# Patient Record
Sex: Female | Born: 1970 | Race: White | Hispanic: No | Marital: Married | Smoking: Never smoker
Health system: Southern US, Community
[De-identification: ages and names within clinical notes are randomized; demographics above are authoritative.]

## PROBLEM LIST (undated history)

## (undated) DIAGNOSIS — D352 Benign neoplasm of pituitary gland: Secondary | ICD-10-CM

## (undated) HISTORY — DX: Benign neoplasm of pituitary gland: D35.2

---

## 1999-05-09 ENCOUNTER — Other Ambulatory Visit: Admission: RE | Admit: 1999-05-09 | Discharge: 1999-05-09 | Payer: Self-pay | Admitting: *Deleted

## 2000-02-20 ENCOUNTER — Other Ambulatory Visit: Admission: RE | Admit: 2000-02-20 | Discharge: 2000-02-20 | Payer: Self-pay | Admitting: Obstetrics and Gynecology

## 2000-04-20 ENCOUNTER — Encounter: Payer: Self-pay | Admitting: Obstetrics and Gynecology

## 2000-04-20 ENCOUNTER — Ambulatory Visit (HOSPITAL_COMMUNITY): Admission: RE | Admit: 2000-04-20 | Discharge: 2000-04-20 | Payer: Self-pay | Admitting: Obstetrics and Gynecology

## 2000-05-28 ENCOUNTER — Ambulatory Visit (HOSPITAL_COMMUNITY): Admission: RE | Admit: 2000-05-28 | Discharge: 2000-05-28 | Payer: Self-pay | Admitting: *Deleted

## 2000-05-28 ENCOUNTER — Encounter: Payer: Self-pay | Admitting: *Deleted

## 2000-06-28 ENCOUNTER — Encounter: Payer: Self-pay | Admitting: Obstetrics and Gynecology

## 2000-06-28 ENCOUNTER — Ambulatory Visit (HOSPITAL_COMMUNITY): Admission: RE | Admit: 2000-06-28 | Discharge: 2000-06-28 | Payer: Self-pay | Admitting: Obstetrics and Gynecology

## 2000-07-10 ENCOUNTER — Inpatient Hospital Stay (HOSPITAL_COMMUNITY): Admission: AD | Admit: 2000-07-10 | Discharge: 2000-07-10 | Payer: Self-pay | Admitting: Obstetrics and Gynecology

## 2000-07-18 ENCOUNTER — Encounter: Payer: Self-pay | Admitting: Obstetrics and Gynecology

## 2000-07-18 ENCOUNTER — Ambulatory Visit (HOSPITAL_COMMUNITY): Admission: RE | Admit: 2000-07-18 | Discharge: 2000-07-18 | Payer: Self-pay | Admitting: Obstetrics and Gynecology

## 2000-08-09 ENCOUNTER — Encounter: Payer: Self-pay | Admitting: Obstetrics and Gynecology

## 2000-08-09 ENCOUNTER — Ambulatory Visit (HOSPITAL_COMMUNITY): Admission: RE | Admit: 2000-08-09 | Discharge: 2000-08-09 | Payer: Self-pay | Admitting: Obstetrics and Gynecology

## 2000-08-20 ENCOUNTER — Encounter (INDEPENDENT_AMBULATORY_CARE_PROVIDER_SITE_OTHER): Payer: Self-pay | Admitting: Specialist

## 2000-08-20 ENCOUNTER — Inpatient Hospital Stay (HOSPITAL_COMMUNITY): Admission: AD | Admit: 2000-08-20 | Discharge: 2000-08-22 | Payer: Self-pay | Admitting: Obstetrics and Gynecology

## 2000-08-24 ENCOUNTER — Encounter: Admission: RE | Admit: 2000-08-24 | Discharge: 2000-10-02 | Payer: Self-pay | Admitting: Obstetrics and Gynecology

## 2001-02-11 ENCOUNTER — Other Ambulatory Visit: Admission: RE | Admit: 2001-02-11 | Discharge: 2001-02-11 | Payer: Self-pay | Admitting: Obstetrics and Gynecology

## 2001-04-01 ENCOUNTER — Other Ambulatory Visit: Admission: RE | Admit: 2001-04-01 | Discharge: 2001-04-01 | Payer: Self-pay | Admitting: Obstetrics and Gynecology

## 2001-04-02 ENCOUNTER — Encounter (INDEPENDENT_AMBULATORY_CARE_PROVIDER_SITE_OTHER): Payer: Self-pay | Admitting: Specialist

## 2001-04-02 ENCOUNTER — Other Ambulatory Visit: Admission: RE | Admit: 2001-04-02 | Discharge: 2001-04-02 | Payer: Self-pay | Admitting: Obstetrics and Gynecology

## 2001-10-07 ENCOUNTER — Other Ambulatory Visit: Admission: RE | Admit: 2001-10-07 | Discharge: 2001-10-07 | Payer: Self-pay | Admitting: Obstetrics and Gynecology

## 2002-02-17 ENCOUNTER — Other Ambulatory Visit: Admission: RE | Admit: 2002-02-17 | Discharge: 2002-02-17 | Payer: Self-pay | Admitting: Obstetrics and Gynecology

## 2002-05-21 ENCOUNTER — Other Ambulatory Visit: Admission: RE | Admit: 2002-05-21 | Discharge: 2002-05-21 | Payer: Self-pay | Admitting: Obstetrics and Gynecology

## 2002-10-01 ENCOUNTER — Other Ambulatory Visit: Admission: RE | Admit: 2002-10-01 | Discharge: 2002-10-01 | Payer: Self-pay | Admitting: Obstetrics and Gynecology

## 2002-10-12 ENCOUNTER — Encounter: Payer: Self-pay | Admitting: Gynecology

## 2002-10-12 ENCOUNTER — Ambulatory Visit (HOSPITAL_COMMUNITY): Admission: RE | Admit: 2002-10-12 | Discharge: 2002-10-12 | Payer: Self-pay | Admitting: Gynecology

## 2002-10-30 HISTORY — PX: OTHER SURGICAL HISTORY: SHX169

## 2003-01-19 ENCOUNTER — Inpatient Hospital Stay (HOSPITAL_COMMUNITY): Admission: AD | Admit: 2003-01-19 | Discharge: 2003-01-21 | Payer: Self-pay | Admitting: Family Medicine

## 2003-01-20 ENCOUNTER — Encounter: Payer: Self-pay | Admitting: Internal Medicine

## 2003-03-04 ENCOUNTER — Other Ambulatory Visit: Admission: RE | Admit: 2003-03-04 | Discharge: 2003-03-04 | Payer: Self-pay | Admitting: Obstetrics and Gynecology

## 2003-05-18 ENCOUNTER — Encounter (INDEPENDENT_AMBULATORY_CARE_PROVIDER_SITE_OTHER): Payer: Self-pay

## 2003-05-18 ENCOUNTER — Ambulatory Visit (HOSPITAL_COMMUNITY): Admission: RE | Admit: 2003-05-18 | Discharge: 2003-05-18 | Payer: Self-pay | Admitting: *Deleted

## 2003-11-21 ENCOUNTER — Inpatient Hospital Stay (HOSPITAL_COMMUNITY): Admission: AD | Admit: 2003-11-21 | Discharge: 2003-11-21 | Payer: Self-pay | Admitting: Obstetrics and Gynecology

## 2003-12-17 ENCOUNTER — Ambulatory Visit (HOSPITAL_COMMUNITY): Admission: RE | Admit: 2003-12-17 | Discharge: 2003-12-17 | Payer: Self-pay | Admitting: Obstetrics and Gynecology

## 2004-03-04 ENCOUNTER — Ambulatory Visit (HOSPITAL_COMMUNITY): Admission: RE | Admit: 2004-03-04 | Discharge: 2004-03-04 | Payer: Self-pay | Admitting: Obstetrics and Gynecology

## 2004-04-07 ENCOUNTER — Inpatient Hospital Stay (HOSPITAL_COMMUNITY): Admission: AD | Admit: 2004-04-07 | Discharge: 2004-04-09 | Payer: Self-pay | Admitting: Obstetrics and Gynecology

## 2004-05-25 ENCOUNTER — Other Ambulatory Visit: Admission: RE | Admit: 2004-05-25 | Discharge: 2004-05-25 | Payer: Self-pay | Admitting: Obstetrics and Gynecology

## 2004-08-30 ENCOUNTER — Encounter: Admission: RE | Admit: 2004-08-30 | Discharge: 2004-08-30 | Payer: Self-pay | Admitting: Endocrinology

## 2005-06-21 ENCOUNTER — Other Ambulatory Visit: Admission: RE | Admit: 2005-06-21 | Discharge: 2005-06-21 | Payer: Self-pay | Admitting: Obstetrics and Gynecology

## 2006-06-27 ENCOUNTER — Other Ambulatory Visit: Admission: RE | Admit: 2006-06-27 | Discharge: 2006-06-27 | Payer: Self-pay | Admitting: Obstetrics and Gynecology

## 2006-09-04 ENCOUNTER — Ambulatory Visit: Payer: Self-pay | Admitting: Sports Medicine

## 2006-09-11 ENCOUNTER — Encounter: Admission: RE | Admit: 2006-09-11 | Discharge: 2006-09-11 | Payer: Self-pay | Admitting: Family Medicine

## 2008-08-26 ENCOUNTER — Other Ambulatory Visit: Admission: RE | Admit: 2008-08-26 | Discharge: 2008-08-26 | Payer: Self-pay | Admitting: Gynecology

## 2010-11-19 ENCOUNTER — Encounter: Payer: Self-pay | Admitting: Endocrinology

## 2011-03-17 NOTE — H&P (Signed)
Samantha Savage, Samantha Savage                             ACCOUNT NO.:  [id number]   MEDICAL RECORD NO.:  [id number]                   PATIENT TYPE:  INP   LOCATION:  5727                                 FACILITY:  [facility]   PHYSICIAN:  Sherin Quarry, MD                   DATE OF BIRTH:  August 21, 1971   DATE OF ADMISSION:  01/19/2003  DATE OF DISCHARGE:                                HISTORY & PHYSICAL   HISTORY OF PRESENT ILLNESS:  The patient is a 40 year old lady who has  always been in good health.  She had a normal pregnancy with birth of  fraternal twins about two years ago.  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  She underwent a transphenoidal resection of the  adenoma at the [facility] on January 22, 2003.  Initially, she felt well  postop, but on Friday, she developed increased nausea and since Saturday,  she has been increasingly fatigued.  She has felt dizzy.  She has also noted  some right upper quadrant aching discomfort.  She has a sense of sinus  pressure at the surgical site.  She presented to [facility] with these complaints and a serum sodium level was done as a screen  for cortisol deficiency and it was found to be 128.  Her cortisol level was  drawn and is pending.  The [facility] was contacted and the  neurosurgeon who did the surgery indicated that he frequently sees nausea  and low sodium shortly after this procedure due to SIADH and the effects of  vasopressin.  He recommended extreme fluid restriction and empiric cortisol  replacement, pending the results of cortisol levels; she was therefore  admitted to accomplish these goals.   ALLERGIES:  She is allergic to SULFA DRUGS.   MEDICATIONS:  She takes Tylenol on a p.r.n. basis.   PAST MEDICAL HISTORY:   ILLNESSES:  None.   OPERATIONS:  The only operation that she has had has been the pituitary  operation as outlined above.   SOCIAL HISTORY:  The patient denies a history of cigarette smoking, alcohol  or drug use.   REVIEW OF SYSTEMS:  HEAD:  Denies headache or dizziness.  EYES:  She denies  visual blurring or diplopia.  EARS, NOSE AND THROAT:  She denies earache,  sinus pain or sore throat.  CHEST:  She denies coughing, wheezing or chest  congestion.  CARDIOVASCULAR:  She denies orthopnea, PND or ankle edema.  GI:  She has noticed loss of appetite.  She feels somewhat nauseated.  She has  also noted some right upper quadrant burning discomfort.  GU:  She denies  dysuria.  She thinks her urine is somewhat cloudy.  She is amenorrheic.  NEUROLOGIC:  She denies seizure or stroke.  ENDOCRINE:  See above.  PHYSICAL EXAMINATION:  GENERAL:  On physical exam, she is a very healthy-  appearing young woman who seems somewhat fatigued and nauseated.  HEENT:  Exam is within normal limits.  NECK:  There is no meningismus.  CHEST:  The chest is clear.  BACK:  Examination of the back reveals no CVA or point tenderness.  CARDIOVASCULAR:  Exam reveals normal S1 and S2.  There are no rubs, murmurs  or gallops.  ABDOMEN:  The abdomen is benign.  There are normal bowel sounds.  There is  very mild tenderness to palpation in the right upper quadrant area without  guarding or rebound.  NEUROLOGIC:  Neurologic testing is normal.  EXTREMITIES:  Examination of extremities is normal.   IMPRESSION:  1. 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  2. Status post transphenoidal pituitary surgery for pituitary adenoma,     amenorrhea/galactorrhea syndrome.  3. Right upper quadrant pain, possible cholecystitis.   PLAN:  As suggested by the neurosurgeon, the patient will have a cortisol  level obtained.  She will be placed on hydrocortisone acetate 50 mg in the  a.m., 25 mg in the p.m., pending the results of the cortisol level.  She  will be rigorously fluid-restricted; we  will try to restrict her total fluid  intake to less than 500 mL every 24 hours.  We will follow her BMET very  closely.  She will be given Phenergan for nausea and Tylenol as needed for  headache.  In light of her complaints of right upper quadrant burning  discomfort and her fairly recent pregnancy, we will obtain a gallbladder  ultrasound.  We will continue communication with the neurosurgical physician  to make sure that we are giving her the best possible advise.                                               Sherin Quarry, MD    SY/MEDQ  D:  01/19/2003  T:  01/20/2003  Job:  [id number]   cc:   Duwayne Heck L. Mahaffey, M.D.  [address]  [place]  [place] [address]  Fax: [phone]

## 2011-03-17 NOTE — Discharge Summary (Signed)
[facility]  Patient:    Samantha Savage, Samantha Savage                          MRN: [id number] Adm. Date:  [id number] Disc. Date: [id number] Attending:  Shaune Spittle Dictator:   Miguel Dibble, C.N.M.                           Discharge Summary  DATE OF BIRTH:                August 13, 1971  ADMISSION DIAGNOSES:          Intrauterine pregnancy at 76 1/2 weeks with spontaneous ruptured membranes, primigravida in early labor, negative group beta strep, twin gestation.  DISCHARGE DIAGNOSES:          Intrauterine pregnancy at 58 1/2 weeks with spontaneous ruptured membranes, primigravida in early labor, negative group beta strep, twin gestation, delivered viable twin girl Apgars 8 and 8 and viable twin boy Apgars 8 and 8.  PROCEDURES:                    Electronic fetal monitoring, ultrasound, Pitocin augmentation.  HOSPITAL COURSE:              On October 22 at approximately 42:27 a.m. 40 year old gravida 2, para 0-0-1-0, [name], at 36 3/7 weeks was admitted after ruptured membranes at 3:30 a.m. with clear fluid.  She was having mild contractions.  Negative group beta strep.  She conceived this pregnancy on Clomid.  She had had an ultrasound one and a half weeks earlier that showed a vertex vertex presentation for both twins.  Her spouse and her had decided upon a normal vaginal delivery.  Her cervix was 2 cm, 80% effaced, vertex at -1 with osseous pooling and ferning of amniotic fluid.  Her ultrasound at the hospital upon admission confirmed a vertex vertex presentation.  Upon admission labor proceeded.  She started on Pitocin augmentation at approximately 9 a.m.  By 1930 hours she delivered viable baby girl, Vena Austria, Apgars 8 and 8 and twin B baby boy, Sharlet Salina, Apgars 8 and 8 over an intact perineum with a first degree vaginal laceration repaired without difficulty. She had reassuring fetal heart tones during the course of her labor.   Placenta had two intact three vessel cords and it appears that there were two placentas.  On postpartum day #1, October 23, she was progressing well. Hemoglobin had dropped from 12.9 to 11.3.  She was breast-feeding satisfactorily.  Received assistance from lactation specialist.  She decided on Micronor for contraception.  On postpartum day #2, October 24, she was breast-feeding and bottle feeding.  Had lots of help at home.  She began to complain of a pruritic rash that was erythematous on her left antecubital site.  A consultation with her dermatologist, Dr. Nita Sells, by phone resulted in treatment with Elocon cream.  She would follow up with him in his office postpartum.  Patient was discharged in stable condition.  She would decided to remain in the hospitality suite with her twin infants should they require further hospitalization due to feeding issues.  DISCHARGE INSTRUCTIONS:       Per [facility] OB/GYN booklet.  DISCHARGE MEDICATIONS:        Micronor, Motrin, prenatal vitamins.  DISCHARGE FOLLOW-UP:          In six weeks at [facility]  and in one week with Dr. Margo Aye for dermatology follow-up. DD:  09/19/00 TD:  09/21/00 Job: [id number]

## 2011-03-17 NOTE — Op Note (Signed)
   NAMEMEDRITH, VEILLON                             ACCOUNT NO.:  [id number]   MEDICAL RECORD NO.:  [id number]                   PATIENT TYPE:  AMB   LOCATION:  DAY                                  FACILITY:  [facility]   PHYSICIAN:  Vikki Ports, M.D.         DATE OF BIRTH:  1971-05-13   DATE OF PROCEDURE:  05/18/2003  DATE OF DISCHARGE:                                 OPERATIVE REPORT   PREOPERATIVE DIAGNOSIS:  Chronic abdominal wound.   POSTOPERATIVE DIAGNOSIS:  Chronic abdominal wound.   OPERATION/PROCEDURE:  Revision of abdominal wound scar.   ANESTHESIA:  Local MAC.   DESCRIPTION OF PROCEDURE:  The patient was taken to the operating room and  placed in the supine position.  After adequate MAC anesthesia was induced,  the abdomen was prepped and draped in the normal sterile fashion.  Using 1%  lidocaine local anesthesia, the skin and subcutaneous tissue in the right  lower quadrant was anesthetized.  Elliptical incision was made around the  previous scar and all scar tissue down to normal subcutaneous fat was  excised en bloc.  Adequate hemostasis was insured and the wound was closed  with a running subcuticular Monocryl.  Steri-Strips and sterile dressing  were applied.  The patient tolerated the procedure well and went to PACU in  good condition.                                                Vikki Ports, M.D.    KRH/MEDQ  D:  05/18/2003  T:  05/18/2003  Job:  [id number]

## 2011-03-17 NOTE — Discharge Summary (Signed)
Samantha Savage, Samantha Savage                             ACCOUNT NO.:  [id number]   MEDICAL RECORD NO.:  [id number]                   PATIENT TYPE:  INP   LOCATION:  5727                                 FACILITY:  [facility]   PHYSICIAN:  Jackie Plum, M.D.             DATE OF BIRTH:  1971/07/26   DATE OF ADMISSION:  01/19/2003  DATE OF DISCHARGE:  01/21/2003                                 DISCHARGE SUMMARY   DISCHARGE DIAGNOSES:  1. 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  2. Status post transphenoidal pituitary surgery for adenoma.  Presented with     amenorrhea and galactorrhea.  3. Right upper quadrant abdominal pain, improved.   DISCHARGE MEDICATIONS:  Cortisol 25 mg two tablets daily for two days and  then one tablet daily.   CONSULTS:  None.   PROCEDURES AND STUDIES:  Ultrasound of the abdomen on January 20, 2003, with  no significant abnormality on complete abdominal ultrasound.  The head of  the pancreas was slightly prominent.  No definite mass or dilation of the  pancreatic duct.  There was a small accessory spleen.   LABORATORY DATA:  Sodium 135, potassium 4.3, chloride 103, CO2 25, BUN 9,  creatinine 0.6, glucose 117.  Cortisol level 13.5.  The urinalysis was  negative.   DISPOSITION:  The patient was discharged home.   CONDITION ON DISCHARGE:  Stable.   HISTORY OF PRESENT ILLNESS:  This is a 40 year old female who recently  developed amenorrhea and galactorrhea and was found to have a large  pituitary adenoma, presumably prolactin secreting.  She underwent a  transphenoidal resection of the adenoma at the [facility] on January 22, 2003.  Initially postoperatively she felt fine.  On Friday, she developed  some increased nausea and since Saturday has increasingly felt fatigued and  dizzy.  She also complained of some right upper quadrant aching discomfort.  The patient has had some sinus pressure at the surgical site.   She presented  to [facility] on the day of admission with these  complaints and a serum sodium level was done as a screen for cortisol  deficiency and it was found to be 128.  A cortisol level was drawn.  The  [facility] was contacted and the neurosurgeon who did the surgery  indicated that he frequently sees nausea and low sodium shortly after this  procedure due to SIADH and the effects of vasopressors.  He recommended  extreme fluid restriction and empiric cortisol replacement pending the  results of cortisol levels and she was admitted for further treatment.   HOSPITAL COURSE:  #1 - HYPONATREMIA SECONDARY TO SYNDROME OF INAPPROPRIATE  ANTIDIURETIC HORMONE SECRETION:  The patient was admitted and put on 500 mL  per day fluid restriction.  She was also provided cortisol 50 mg every a.m.  and 25 mg in the p.m.  per her neurosurgeon's recommendations.  BMETs were  drawn every 12 hours.  Her sodium responded and on discharge was 135.  She  continued to have intermittent headaches that were responsive to Tylenol and  had no nausea or vomiting.  Her cortisol is being weaned over the next two  days and then she is to take 25 mg daily.   #2 - RIGHT UPPER QUADRANT PAIN:  The patient's abdomen was soft and  nondistended on initial examination.  She had good bowel sounds.  She had  some mild tenderness to her right upper quadrant.  An ultrasound was  performed with results as noted above.  This possibly may need to be  rescanned in the future if her symptoms do not resolve.   FOLLOW-UP:  The patient has an appointment on January 26, 2003, with Danielle  L. Mahaffey, M.D.   SPECIAL INSTRUCTIONS:  She has been instructed to restrict her fluid intake  to 1 L daily with a maximum of 2 L daily.     Stephanie Swaziland, NP                      Jackie Plum, M.D.    SJ/MEDQ  D:  01/21/2003  T:  01/22/2003  Job:  [id number]   cc:   Duwayne Heck L. Mahaffey, M.D.  [address]  [place]  [place] [address]  Fax: [phone]

## 2011-03-17 NOTE — H&P (Signed)
Samantha Savage, Samantha Savage                             ACCOUNT NO.:  [id number]   MEDICAL RECORD NO.:  [id number]                   PATIENT TYPE:  INP   LOCATION:  9144                                 FACILITY:  [facility]   PHYSICIAN:  Hal Morales, M.D.             DATE OF BIRTH:  October 31, 1970   DATE OF ADMISSION:  04/07/2004  DATE OF DISCHARGE:                                HISTORY & PHYSICAL   Samantha Savage is a 40 year old gravida 3, para 1012, at 39-6/7 weeks who  presented scheduled for induction but was also in early labor.  She denied  any spontaneous rupture, reports uterine contractions every 5 minutes,  positive fetal movement noted.  Induction had been scheduled for favorable  cervix and family childcare issues.  Pregnancy had been remarkable for:  1. Previous twin gestation.  2. History of cryosurgery.  3. History of removal of pituitary tumor.  4. History of previous delivery at 36 weeks.  5. Positive group B strep.    PRENATAL LABS:  Blood type is 0 positive, Rh antibody negative, VDRL  nonreactive, Rubella titer positive, hepatitis B surface antigen negative,  HIV nonreactive.  GC and Chlamydia cultures were negative, Pap was normal.  Hemoglobin upon entering the practice was 12.3, it was 11.6 at 28 weeks.  EDC of April 08, 2004, was established by ultrasound at approximately 10  weeks and in agreement with further ultrasounds during her pregnancy.  One  hour Glucola was elevated at 142, but three hour GGT was normal.  Group B  strep culture was positive at 36 weeks.  GC and Chlamydia cultures were  negative.   HISTORY OF PRESENT PREGNANCY:  Patient appeared at approximately 10 weeks.  She had an ultrasound on her first visit for dating purposes.  She had  echogenic intracardiac focus noted on her 19 week ultrasound but then had a  quadruple screen that was normal.  She did have some significant issues with  acne but she is followed at [facility] by a dermatologist.  She had  another  ultrasound at 24 weeks.  Echogenic intracardiac focus was noted to be  resolved, anatomy was normal with positive nasal bone seen.  She had some  cramping at 25 weeks and was placed on ibuprofen.  She was exposed to fifth  disease at approximately 30 weeks.  Parvo titer is documented immunity.  The  rest of her pregnancy was essentially uncomplicated.  On her last visit on  April 06, 2004, she was noted to be 3 cm, 80% vertex and -1.  She did request  induction secondary to the availability of family care for her twin  children.  Risks and benefits were reviewed with the patient and patient did  agree to accept those risks and benefits.  She is, therefore, scheduled for  induction today.   OBSTETRICAL HISTORY:  In 2000 she had a spontaneous miscarriage at 21  weeks.  In 2001 she had a vaginal birth of twins, first infant was a female infant  weight 5 pounds 4 ounces at 36-4/7 weeks.  She was in labor approximately 30  hours.  She had epidural anesthesia.  The second twin was a female, weight 5  pounds 7 ounces which she delivered vaginally as well.  She achieved  pregnancy in 2001 using Clomid.  She did have some preterm labor at 34 weeks  with those twins but delivered at 36-1/2 weeks.   MEDICAL HISTORY:  She was a previous oral contraceptive user.  She had an  abnormal Pap in 2002, had colposcopy and cryosurgery, now all Paps were  normal.  She has a history of yeast infection.  She has history of the  pituitary tumor that was noncancerous that was removed in March, 2004.  She  has a history of UTIs, wisdom teeth removed in 1993.  She was readmitted to  the hospital in March of 2004 after her surgery due to swelling in her hand.  Genetic history is unremarkable.   ALLERGIES:  SULFA, WHICH CAUSES HIVES.   SOCIAL HISTORY:  Patient is married to the father of the baby, he is  involved and supportive, his name is Keimora Swartout.  The patient is Caucasian,  of Catholic faith.  She is  graduate educated and is employed at [employer].  Her husband has a Naval architect, he is employed in [employer].  She has been followed by the physician service of  [facility].  She denies any alcohol, drug or tobacco use during this  pregnancy.   PHYSICAL EXAM:  VITAL SIGNS:  Stable, patient is afebrile.  HEENT:  Within normal limits.  LUNGS:  The breath sounds are clear.  HEART:  Regular rate and rhythm without murmur.  BREASTS:  Soft and nontender.  ABDOMEN:  Fundal height is approximately 38 cm.  Estimated fetal weight 7-8  pounds.  Uterine contractions every 3-5 minutes, mild to moderate quality.  CERVICAL EXAM:  By R.N.  Exam was 4-5 cm, 100% vertex and -1 station.  Fetal  heart rate is reactive with no decelerations.  EXTREMITIES:  Deep tendon reflexes are 2+ without clonus.  There is no trace  edema noted.   IMPRESSION:  1. Intrauterine pregnancy at 39-6/7 weeks.  2. Early active labor.  3. Positive group B strep.  4. History of benign pituitary tumor.   PLAN:  1. Admit to birthing suite for consult with Dr. Dierdre Forth as attending     physician.  2. Routine physician orders.  3. Plan group B strep prophylaxis with penicillin G per standard dosing.  4. Epidural per patient request.     Renaldo Reel. Emilee Hero, C.N.M.                   Hal Morales, M.D.    Leeanne Mannan  D:  04/07/2004  T:  04/07/2004  Job:  [id number]

## 2013-03-12 ENCOUNTER — Other Ambulatory Visit: Payer: Self-pay | Admitting: Gynecology

## 2013-03-12 DIAGNOSIS — D353 Benign neoplasm of craniopharyngeal duct: Secondary | ICD-10-CM

## 2013-03-12 DIAGNOSIS — E349 Endocrine disorder, unspecified: Secondary | ICD-10-CM

## 2013-03-15 ENCOUNTER — Other Ambulatory Visit: Payer: Self-pay

## 2013-03-18 ENCOUNTER — Other Ambulatory Visit: Payer: Self-pay

## 2013-03-20 ENCOUNTER — Other Ambulatory Visit: Payer: Self-pay

## 2013-08-07 ENCOUNTER — Other Ambulatory Visit: Payer: Self-pay | Admitting: Dermatology

## 2015-03-30 ENCOUNTER — Other Ambulatory Visit: Payer: Self-pay

## 2015-03-31 LAB — CYTOLOGY - PAP

## 2016-01-19 ENCOUNTER — Other Ambulatory Visit: Payer: Self-pay | Admitting: Physician Assistant

## 2016-01-19 DIAGNOSIS — E049 Nontoxic goiter, unspecified: Secondary | ICD-10-CM

## 2016-01-21 ENCOUNTER — Ambulatory Visit
Admission: RE | Admit: 2016-01-21 | Discharge: 2016-01-21 | Disposition: A | Discharge status: Home / Self Care | Payer: PRIVATE HEALTH INSURANCE | Source: Ambulatory Visit | Attending: Physician Assistant | Admitting: Physician Assistant

## 2016-01-21 DIAGNOSIS — E049 Nontoxic goiter, unspecified: Secondary | ICD-10-CM

## 2016-04-24 ENCOUNTER — Ambulatory Visit (INDEPENDENT_AMBULATORY_CARE_PROVIDER_SITE_OTHER): Payer: PRIVATE HEALTH INSURANCE | Admitting: Sports Medicine

## 2016-04-24 ENCOUNTER — Encounter: Payer: Self-pay | Admitting: Sports Medicine

## 2016-04-24 VITALS — Ht 67.0 in | Wt 130.0 lb

## 2016-04-24 DIAGNOSIS — S73199A Other sprain of unspecified hip, initial encounter: Secondary | ICD-10-CM | POA: Diagnosis not present

## 2016-04-24 DIAGNOSIS — S76019A Strain of muscle, fascia and tendon of unspecified hip, initial encounter: Secondary | ICD-10-CM | POA: Insufficient documentation

## 2016-04-24 NOTE — Patient Instructions (Signed)
Follow exercise sheet and Perform hip abduction exercises 3 sets of 15 Build up to 30 reps and start using an ankle weight  Follow up as needed

## 2016-04-24 NOTE — Progress Notes (Signed)
Ht 5\' 7"  (1.702 m)  Wt 130 lb (58.968 kg)  BMI 20.36 kg/m2  Patient is a runner that comes in with right hip pain Mom has a history of bilateral hip replacements due to loss of cartilage. First time was at age 45  Pain located in lateral right hip Saw [facility] and had negative xrays, per patient Hurts when getting up after sitting for a while Pain starts as soon as she begins running and then calms down After running about an hour on treadmill it feels weak and painful Sometimes gets some left lateral hip pain as well  Past Hx of piriformis problems No medical ills  Fam HX Neg for RA or Lupus  ROS No joint swelling No LBP No sciatic sxs  Exam: Equal leg length and good alignment SI joints move freely Corky Sox is normal on right, and left Right hip external rotation 70 deg, 30 deg for internal Left hip external rotation 70 deg and 30 deg internal Hamstring 70 deg on right, 60 deg on left, strength is fine Hip flexion is strong bilaterally Piriformis stretch on right is tight, less tight on left H test is 80 deg on right and left Hip abduction is weak bilaterally  Glut med and tensor fascialatta is weak bilateral Glut max is fine bilateral

## 2016-04-24 NOTE — Assessment & Plan Note (Signed)
I think her lateral hip pain relates to the lateral weakness Plan a good HEP  Running form is fine and she can cont running while strenght building  Reassured that she shows no sign of OA

## 2016-06-26 ENCOUNTER — Ambulatory Visit (INDEPENDENT_AMBULATORY_CARE_PROVIDER_SITE_OTHER): Payer: PRIVATE HEALTH INSURANCE | Admitting: Internal Medicine

## 2016-06-26 ENCOUNTER — Encounter: Payer: Self-pay | Admitting: Internal Medicine

## 2016-06-26 DIAGNOSIS — E229 Hyperfunction of pituitary gland, unspecified: Secondary | ICD-10-CM | POA: Diagnosis not present

## 2016-06-26 DIAGNOSIS — D352 Benign neoplasm of pituitary gland: Secondary | ICD-10-CM

## 2016-06-26 DIAGNOSIS — R7989 Other specified abnormal findings of blood chemistry: Secondary | ICD-10-CM

## 2016-06-26 NOTE — Progress Notes (Addendum)
Patient ID: Samantha Savage, female   DOB: [birth year], 45 y.o.   MRN: [id number]    HPI  Samantha Savage is a 45 y.o.-year-old female, referred by her PCP, Dr. Dimas Aguas, for management for h/o pituitary adenoma and elevated prolactin.  She has been having difficulty getting pregnant in 1999 >> needed Clomid >> had twins. She then wanted a new pregnancy but she continued to lactate and had problems getting pregnant again >> saw a fertility Dr. >> PRL was high >> obtained an MRI >> pituitary tumor.   She had transsphenoidal surgery for macroadenoma with hemorrhage in 2004, at [facility] clinic. 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 She became pregnant few mo after the surgery.  She has had serial MRIs in the past, and was advised to continue to have MRIs every 5 years. Her last MRI available for review was on 09/11/2006: Satisfactory postoperative appearance status post transsphenoidal removal of a pituitary macroadenoma. Previous MRI from 08/31/2004 showed: Improved appearance of the pituitary gland post operatively.  Status post resection of reported hemorrhagic mass likely a macroadenoma.   Her prolactin levels were followed by OB/GYN in the past.   Latest prolactin level: 04/21/2016: PRL 26.93 (3.34-26.72)   Pt mentions: - no headaches - no weight gain - no fatigue - no constipation - no dry skin - no hair loss - no depression  Her menstrual cycles are regular. She cannot remember if she went for a run before the last prolactin level.  Patient is a runner, she runs 3 times a week.  I reviewed pt's thyroid tests: 01/19/2016: TSH 1.16, free T4 0.75, free T3 3.37 (2.5-3.9) 05/12/2013: TSH 0.49 02/08/2012: TSH 0.95   She has an aunt with Cushing's ds >> had TSR. Uncle has pituitary insufficiency.  ROS: Constitutional: no weight gain/loss, no fatigue, no subjective hyperthermia/hypothermia Eyes: no blurry vision, no xerophthalmia ENT: no sore throat, no nodules palpated in  throat, no dysphagia/odynophagia, no hoarseness Cardiovascular: no CP/SOB/palpitations/leg swelling Respiratory: no cough/SOB Gastrointestinal: no N/V/D/C Musculoskeletal: no muscle/joint aches Skin: no rashes Neurological: no tremors/numbness/tingling/dizziness Psychiatric: no depression/anxiety  Past Medical History:  Diagnosis Date  . Pituitary macroadenoma [facility])    s/p TSR at [facility] 2004    Past Surgical History:  Procedure Laterality Date  . Trans-sphenoidal surgery   2004    Social History   Social History  . Marital status: Married    Spouse name: N/A  . Number of children: 3   Occupational History  . counselor   Social History Main Topics  . Smoking status: Never Smoker  . Smokeless tobacco: No  . Alcohol use No  . Drug use: No   Meds: none  Allergies  Allergen Reactions  . Sulfa Antibiotics Rash   FH: see HPI.  PE: BP 110/70 (BP Location: Left Arm, Patient Position: Sitting)   Pulse (!) 44   Ht 5\' 7"  (1.702 m)   Wt 130 lb (59 kg)   LMP 06/23/2016   SpO2 97%   BMI 20.36 kg/m  Wt Readings from Last 3 Encounters:  06/26/16 130 lb (59 kg)  04/24/16 130 lb (59 kg)   Constitutional: normal weight, in NAD Eyes: PERRLA, EOMI, no exophthalmos ENT: moist mucous membranes, no thyromegaly, no cervical lymphadenopathy Cardiovascular: RRR, No MRG Respiratory: CTA B Gastrointestinal: abdomen soft, NT, ND, BS+ Musculoskeletal: no deformities, strength intact in all 4 Skin: moist, warm, no rashes Neurological: no tremor with outstretched hands, DTR normal in all 4  ASSESSMENT: 1. H/o  Pituitary macroadenoma  2. Elevated Prolactin  PLAN:  1. Patient with a h/o  pituitary macroadenoma, discovered during investigation for infertility. She describes that she could not stop lactating after the birth of her twins, and she saw a fertility specialist at that point that diagnosed her with a pituitary tumor. She had the TSR surgery in 2004 at [facility]  clinic. The surgery was successful, and she did not have pituitary insufficiency afterwards. - I reviewed the pituitary MRI reports from 2005 and 2007 along with the patient. No recurrences evident at that point. - We decided to repeat a pituitary MRI this year. She would like an open MRI. We discussed that repeating an MRI at least every 5 years is still indicated, since pituitary tumors can recur (I suspect that hers was a non-functional adenoma, but I do not have the pathology report - we will need to obtain this) - Since the prolactin was slightly high, will also check the pituitary hormones (see below), but I have low suspicion for a hormonal imbalance since her menstrual cycles are still regular and she has no complaints today. - I ordered the following labs (she will return at 8 AM): Orders Placed This Encounter  Procedures  . MR Brain W Wo Contrast  . Prolactin  . Luteinizing hormone  . Follicle stimulating hormone  . Cortisol  . ACTH  . Insulin-like growth factor  - She had recent TFTs checked by PCP and is were normal. We'll not repeat. - Return to clinic in 1 year; we will repeat the prolactin level at that time.  2. Elevated prolactin - Please see above  - We also discussed that a slightly elevated prolactin can be transient, and common, and possibly not related to any pituitary pathology. I advised her not to go for a run in the morning of the labs; we also discussed about other factors that can influence the level: Patient with persistent high prolactin levels, and without evidence of a pituitary tumor.  - I discussed with the patient about possible etiologies of high prolactin levels (besides a pregnancy):  Sex  Hypothyroidism   Stress   Exercise   Lack of sleep   Medications (she is not taking any medicines)  Drugs (denies)  Chest wall lesions (denies) - high prolactin most frequently impacts menstrual cycles, while for her, menstrual cycles are regular. She also  denies galactorrhea, headaches, or any other symptoms If prolactin level remains high, we may need to  check monomeric and total prolactin.  Component     Latest Ref Rng & Units 07/05/2016  IGF-I, LC/MS     52 - 328 ng/mL 67  Z-Score (Female)     -2.0 - 2.0 SD -1.5  Prolactin     ng/mL 18.5  LH     mIU/mL 11.96  FSH     mIU/ML 8.7  Cortisol, Plasma     ug/dL 9.6  C206 ACTH     6 - 50 pg/mL 30   All labs are normal, including prolactin.  Philemon Kingdom, MD PhD [facility]

## 2016-06-26 NOTE — Patient Instructions (Signed)
Please schedule a lab appt for 8 am.  We will let you know about the MRI schedule.  Please return in 1 year.

## 2016-07-05 ENCOUNTER — Other Ambulatory Visit (INDEPENDENT_AMBULATORY_CARE_PROVIDER_SITE_OTHER): Payer: PRIVATE HEALTH INSURANCE

## 2016-07-05 DIAGNOSIS — R7989 Other specified abnormal findings of blood chemistry: Secondary | ICD-10-CM

## 2016-07-05 DIAGNOSIS — E229 Hyperfunction of pituitary gland, unspecified: Secondary | ICD-10-CM

## 2016-07-05 DIAGNOSIS — D352 Benign neoplasm of pituitary gland: Secondary | ICD-10-CM | POA: Diagnosis not present

## 2016-07-05 LAB — LUTEINIZING HORMONE: LH: 11.96 m[IU]/mL

## 2016-07-05 LAB — CORTISOL: Cortisol, Plasma: 9.6 ug/dL

## 2016-07-05 LAB — FOLLICLE STIMULATING HORMONE: FSH: 8.7 m[IU]/mL

## 2016-07-06 LAB — PROLACTIN: Prolactin: 18.5 ng/mL

## 2016-07-07 ENCOUNTER — Encounter: Payer: Self-pay | Admitting: Internal Medicine

## 2016-07-07 ENCOUNTER — Telehealth: Payer: Self-pay | Admitting: Internal Medicine

## 2016-07-07 ENCOUNTER — Telehealth: Payer: Self-pay

## 2016-07-07 LAB — ACTH: C206 ACTH: 30 pg/mL (ref 6–50)

## 2016-07-07 NOTE — Telephone Encounter (Signed)
Called patient and notified that some labs were in, but not all of them and Dr.Gherghe had not resulted them yet. Also advised patient that referral was in, but it maybe next week before she hears from them on scheduling a time. Gave call back number if patient had any other questions.

## 2016-07-07 NOTE — Telephone Encounter (Signed)
PT calling in for her lab results, also said she has not heard from the MRI referral.

## 2016-07-09 LAB — INSULIN-LIKE GROWTH FACTOR
IGF-I, LC/MS: 67 ng/mL (ref 52–328)
Z-Score (Female): -1.5 SD (ref ?–2.0)

## 2016-07-10 ENCOUNTER — Telehealth: Payer: Self-pay

## 2016-07-10 NOTE — Telephone Encounter (Signed)
Called and left message for patient advising of normal lab results, gave call back number if any questions or concerns.

## 2017-08-24 IMAGING — US US THYROID
1 series · 13 of 25 positions shown · non-contrast
Comparison: None.

CLINICAL DATA: Enlarged thyroid on physical examination.

EXAM:
THYROID ULTRASOUND
TECHNIQUE: Ultrasound examination of the thyroid gland and adjacent soft
tissues was performed.

[Series 1: us thyroid · 0.07mm/px · 13 of 50 slices shown]
[im 1/50]
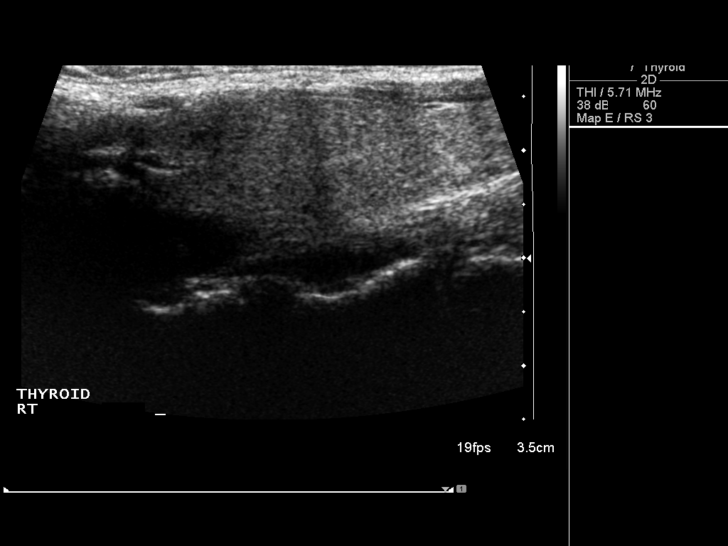
[im 5/50]
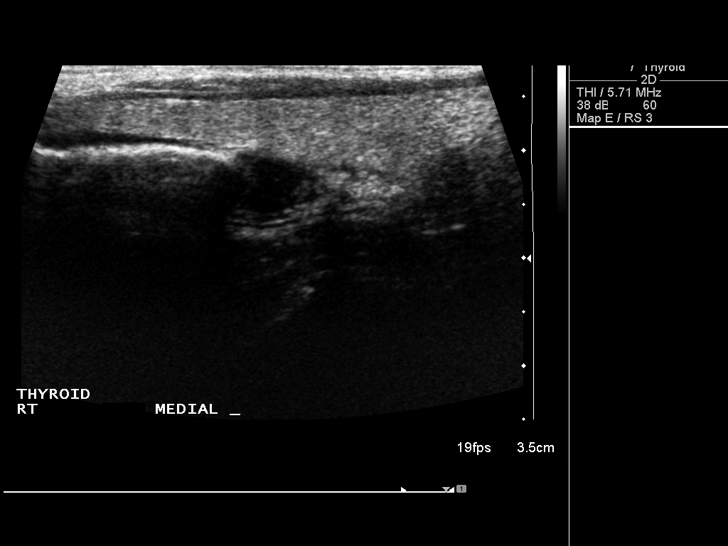
[im 9/50]
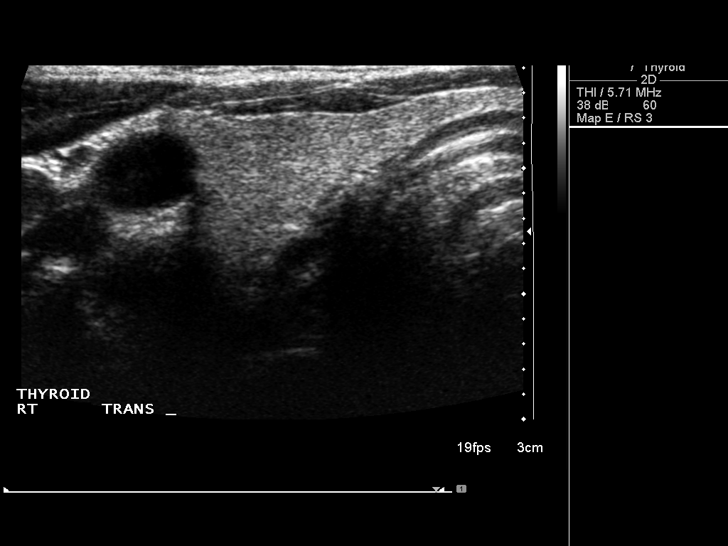
[im 13/50]
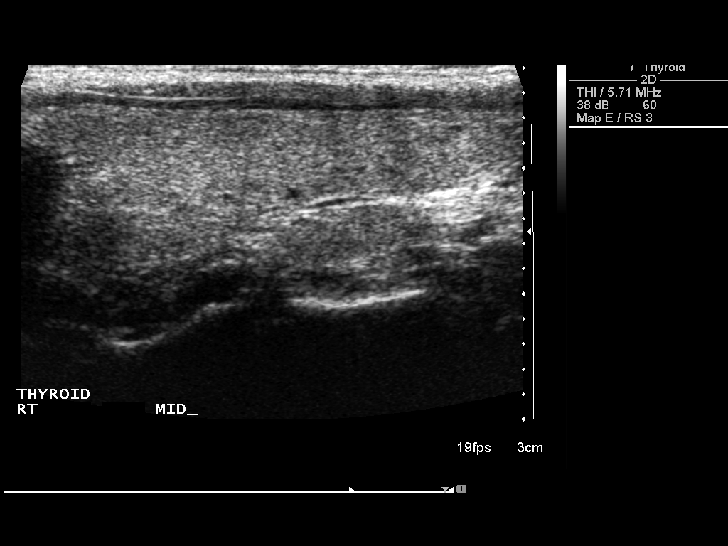
[im 17/50]
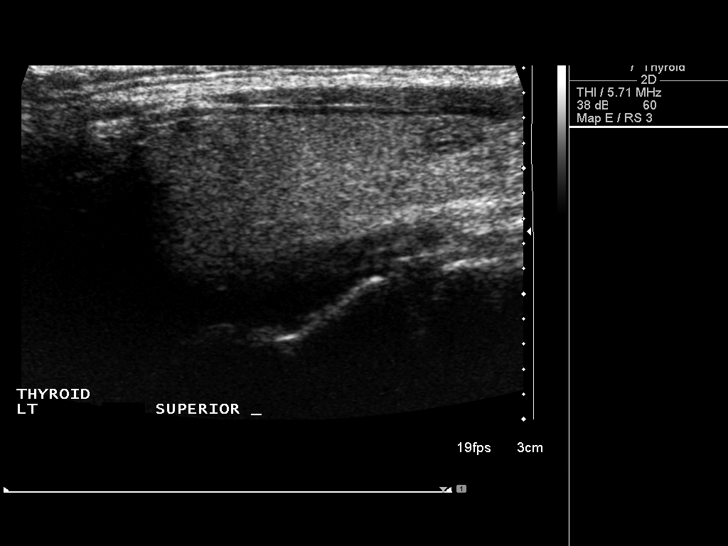
[im 21/50]
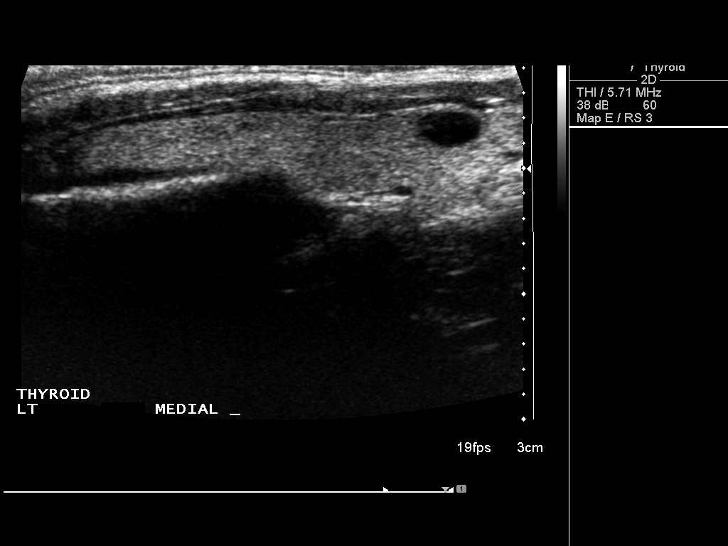
[im 25/50]
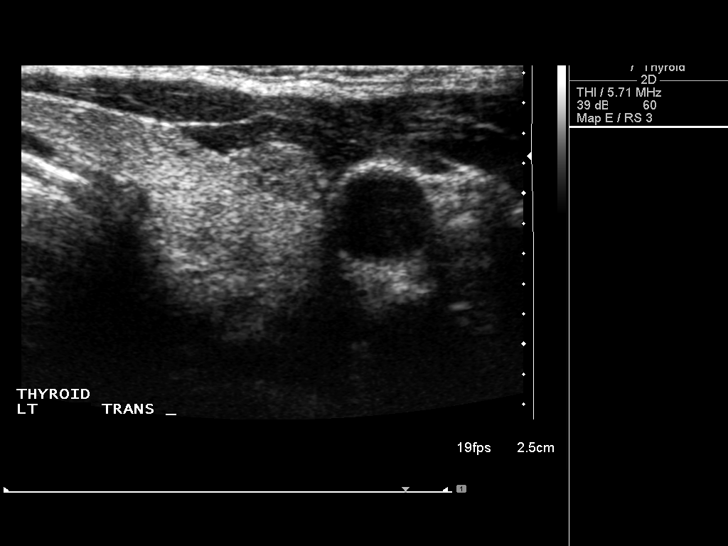
[im 29/50]
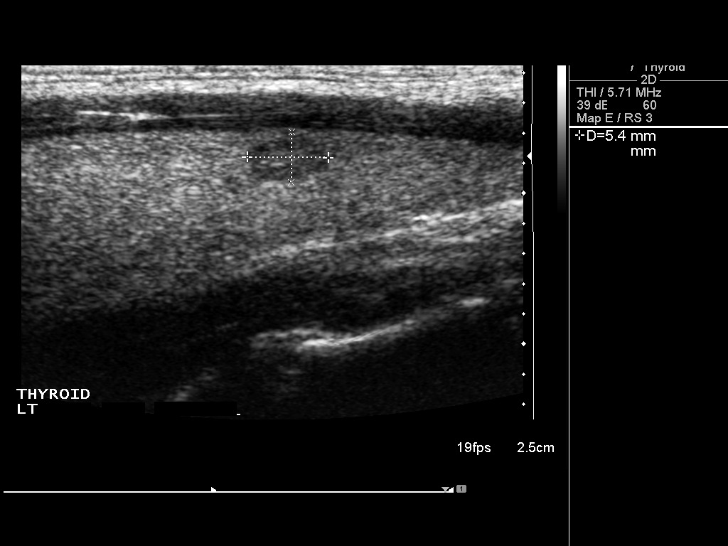
[im 33/50]
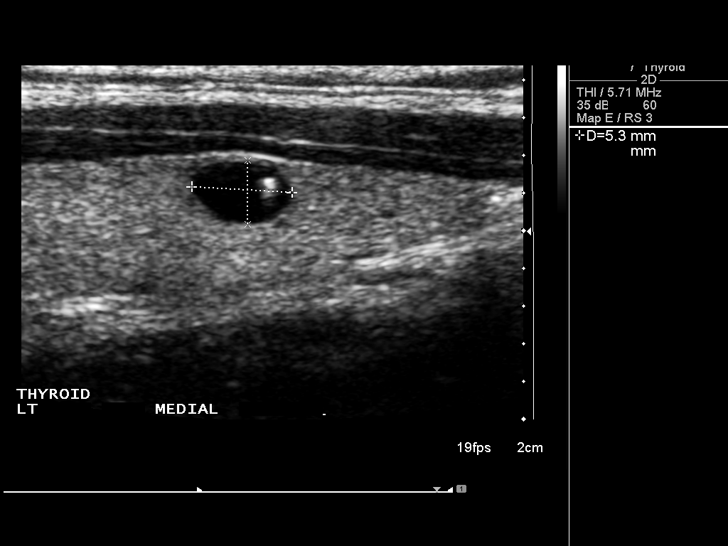
[im 37/50]
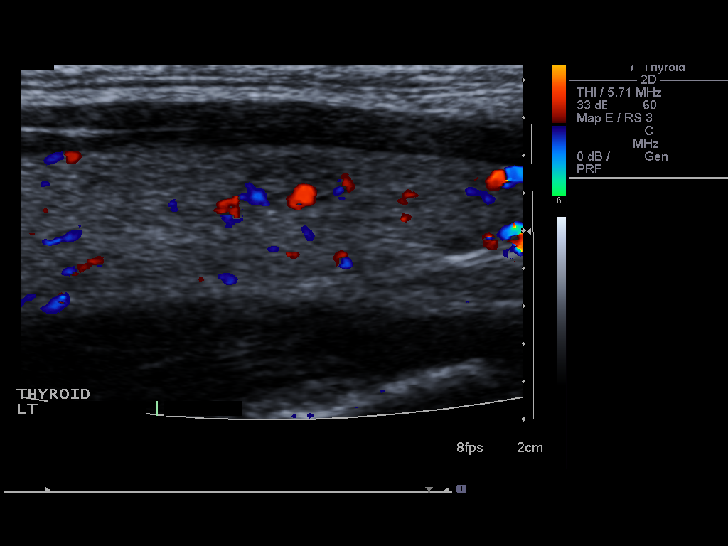
[im 41/50]
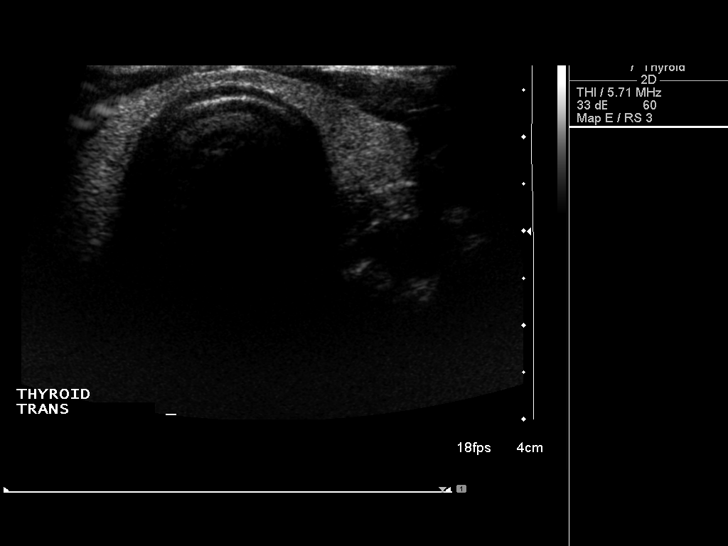
[im 45/50]
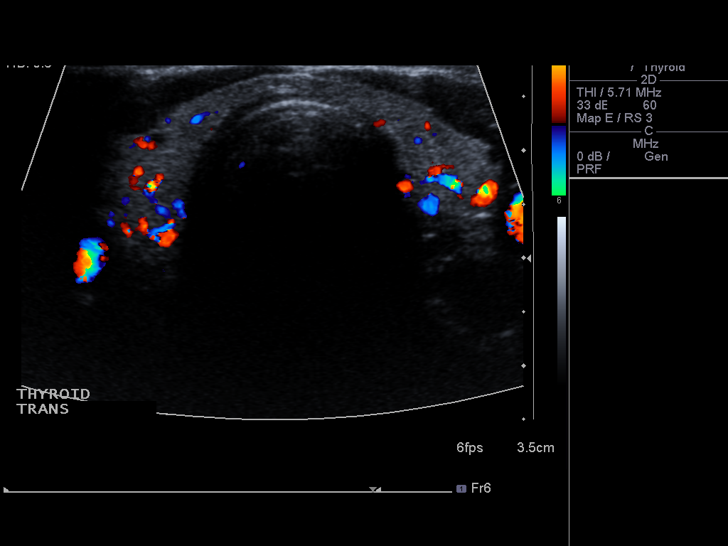
[im 50/50]
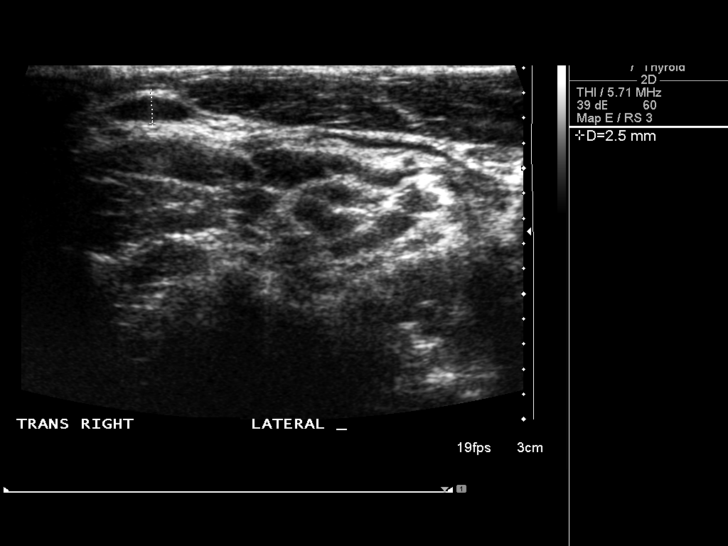

[13 of 25 positions shown; findings below may reference images not displayed]

FINDINGS: There is relative homogeneity the thyroid parenchymal echotexture.

Right thyroid lobe

Measurements: Normal in size measuring 5.0 x 1.1 x 2.2 cm

There is relative homogeneity of the right lobe of the thyroid
without discrete nodule or mass.

Left thyroid lobe

Measurements: Normal in size measuring 5.1 x 1.2 x 1.4 cm.

Right, mid/anterior, lateral - 0.5 x 0.3 x 0.4 cm - mixed echogenic,
solid.

Right, inferior - 0.5 x 0.3 x 0.5 cm-anechoic with eccentric
echogenic nodule with ring down artifact suggestive of colloid

Isthmus

Thickness: Normal in size measures 0.2 cm in diameter

No discrete nodule or mass identified within the thyroid isthmus.

Lymphadenopathy

None visualized.
IMPRESSION: 1. No evidence of thyromegaly or large thyroid nodule or mass.
2. Punctate (sub 5 mm) left-sided thyroid nodules, none of which
currently meet imaging criteria to recommend percutaneous sampling.
This recommendation follows the consensus statement: Management of
Thyroid Nodules Detected at US: Society of Radiologists in

## 2020-09-09 ENCOUNTER — Ambulatory Visit: Payer: PRIVATE HEALTH INSURANCE | Admitting: Sports Medicine

## 2022-02-27 ENCOUNTER — Encounter: Payer: Self-pay | Admitting: Internal Medicine

## 2022-02-27 ENCOUNTER — Ambulatory Visit (INDEPENDENT_AMBULATORY_CARE_PROVIDER_SITE_OTHER): Payer: No Typology Code available for payment source | Admitting: Internal Medicine

## 2022-02-27 VITALS — BP 110/70 | HR 72 | Ht 67.0 in | Wt 128.0 lb

## 2022-02-27 DIAGNOSIS — Z86018 Personal history of other benign neoplasm: Secondary | ICD-10-CM

## 2022-02-27 DIAGNOSIS — Z9889 Other specified postprocedural states: Secondary | ICD-10-CM

## 2022-02-27 DIAGNOSIS — E221 Hyperprolactinemia: Secondary | ICD-10-CM | POA: Diagnosis not present

## 2022-02-27 NOTE — Progress Notes (Signed)
? ? ?Name: Samantha Savage  ?MRN/ DOB: [id number], 1971/10/22    ?Age/ Sex: 51 y.o., female   ? ?PCP: Michel Harrow, PA-C   ?Reason for Endocrinology Evaluation: Pituitary microadenoma  ?   ?Date of Initial Endocrinology Evaluation: 02/27/2022   ? ? ?HPI: ?Samantha Savage is a 51 y.o. female with a past medical history of positive ANA, vitamin D deficiency and history of transsphenoidal surgery secondary to pituitary macroadenoma. The patient presented for initial endocrinology clinic visit on 02/27/2022 for consultative assistance with her Pituitary microadenoma.  ? ? ?Patient has been diagnosed with pituitary macroadenoma/prolactinoma during evaluation of galactorrhea.  ? ?She had a history of irregular menstruation and infertility issues requiring Clomid intake.  She delivered twins in 2001, she nursed shortly after that but galactorrhea continued until the diagnosis of pituitary macroadenoma in 2004. ? ?She is S/P transphenoidal pituitary resection 12/2002 ? ?Prolactin has been fluctuating over the years with most recent prolactin being 41 uIU/mL  ? ? ?Denies galactorrhea  ?LMP 02/06/2022 has been regular  ?Denies new onset headaches  ?Denies visual changes  ?No change in ring of shoe sizes ?Denies excessive sweating  ?NO antacid use  ? ? ?Mother with Berenice Primas' disease ?Paternal uncle with hypopituitarism ?Paternal aunt with cushing syndrome  ? ? ? ? ? ? ? ? ?HISTORY:  ?Past Medical History:  ?Past Medical History:  ?Diagnosis Date  ?? Pituitary macroadenoma ([place])   ? s/p TSR at [facility] 2004  ? ?Past Surgical History:   ?Social History:  reports that she has never smoked. She has never used smokeless tobacco. She reports that she does not drink alcohol and does not use drugs. ?Family History: family history is not on file. ? ? ?HOME MEDICATIONS: ?Allergies as of 02/27/2022   ? ?   Reactions  ? Sulfa Antibiotics Rash  ? ?  ? ?  ?Medication List  ?  ? ?  ? Accurate as of Feb 27, 2022  3:45 PM. If you have any  questions, ask your nurse or doctor.  ?  ?  ? ?  ? ?cholecalciferol 25 MCG (1000 UNIT) tablet ?Commonly known as: VITAMIN D3 ?Take 1,000 Units by mouth daily. ?  ?Magnesium 300 MG Caps ?Take 1 capsule by mouth daily. ?  ?MULTIVITAMINS PO ?See admin instructions. ?  ?vitamin B-12 1000 MCG tablet ?Commonly known as: CYANOCOBALAMIN ?Take 1,000 mcg by mouth daily. ?  ? ?  ?  ? ? ?REVIEW OF SYSTEMS: ?A comprehensive ROS was conducted with the patient and is negative except as per HPI ? ? ? ?OBJECTIVE:  ?VS: BP 110/70 (BP Location: Left Arm, Patient Position: Sitting, Cuff Size: Small)   Pulse 72   Ht '5\' 7"'$  (1.702 m)   Wt 128 lb (58.1 kg)   SpO2 96%   BMI 20.05 kg/m?   ? ?Wt Readings from Last 3 Encounters:  ?02/27/22 128 lb (58.1 kg)  ?06/26/16 130 lb (59 kg)  ?04/24/16 130 lb (59 kg)  ? ? ? ?EXAM: ?General: Pt appears well and is in NAD  ?Neck: General: Supple without adenopathy. ?Thyroid: Thyroid size normal.  No goiter or nodules appreciated.  ?Lungs: Clear with good BS bilat with no rales, rhonchi, or wheezes  ?Heart: Auscultation: RRR.  ?Abdomen: Normoactive bowel sounds, soft, nontender, without masses or organomegaly palpable  ?Extremities:  ?BL LE: No pretibial edema normal ROM and strength.  ?Mental Status: Judgment, insight: Intact ?Orientation: Oriented to time, place, and person ?Mood and affect:  No depression, anxiety, or agitation  ? ? ? ?DATA REVIEWED: ? ?  ? ?ASSESSMENT/PLAN/RECOMMENDATIONS:  ? ?Hx prolactinoma/pituitary macroadenoma: ? ?-She is S/P transsphenoidal pituitary resection 12/2002 ?-Recent elevation in prolactin ?-We will proceed with fasting pituitary hormonal check ?-We will proceed with pituitary MRI ?- We discussed treatment options to include cabergoline if necessary ?-At this time there is no clinical concern for hyper or hypopituitarism ? ? ? ?Follow-up in 4 months ? ?Signed electronically by: ?Abby Nena Jordan, MD ? ?[facility]  ?[facility] ?[address] ?[place], [place] [address] ?Phone: [phone] ?FAX: [phone] ? ? ?CC: ?Michel Harrow, PA-C ?[place] ?[place] [address] ?Phone: [phone] ?Fax: [phone] ? ? ?Return to Endocrinology clinic as below: ?Future Appointments  ?Date Time Provider [place]  ?07/17/2022  7:30 AM Farhaan Mabee, Melanie Crazier, MD [facility] None  ?  ? ? ? ? ? ?

## 2022-05-24 ENCOUNTER — Other Ambulatory Visit (INDEPENDENT_AMBULATORY_CARE_PROVIDER_SITE_OTHER): Payer: No Typology Code available for payment source

## 2022-05-24 DIAGNOSIS — Z9889 Other specified postprocedural states: Secondary | ICD-10-CM | POA: Diagnosis not present

## 2022-05-24 DIAGNOSIS — Z86018 Personal history of other benign neoplasm: Secondary | ICD-10-CM | POA: Diagnosis not present

## 2022-05-24 LAB — BASIC METABOLIC PANEL
BUN: 14 mg/dL (ref 6–23)
CO2: 26 mEq/L (ref 19–32)
Calcium: 9.7 mg/dL (ref 8.4–10.5)
Chloride: 105 mEq/L (ref 96–112)
Creatinine, Ser: 0.72 mg/dL (ref 0.40–1.20)
GFR: 98.16 mL/min (ref >60.00)
Glucose, Bld: 86 mg/dL (ref 70–99)
Potassium: 4.5 mEq/L (ref 3.5–5.1)
Sodium: 139 mEq/L (ref 135–145)

## 2022-05-24 LAB — T4, FREE: Free T4: 0.81 ng/dL (ref 0.60–1.60)

## 2022-05-24 LAB — FOLLICLE STIMULATING HORMONE: FSH: 6.4 m[IU]/mL

## 2022-05-24 LAB — CORTISOL: Cortisol, Plasma: 9.1 ug/dL

## 2022-05-24 LAB — TSH: TSH: 1.4 u[IU]/mL (ref 0.35–5.50)

## 2022-05-25 ENCOUNTER — Encounter: Payer: Self-pay | Admitting: Internal Medicine

## 2022-05-31 LAB — INSULIN-LIKE GROWTH FACTOR
IGF-I, LC/MS: 91 ng/mL (ref 52–328)
Z-Score (Female): -0.9 SD (ref ?–2.0)

## 2022-05-31 LAB — PROLACTIN: Prolactin: 17.1 ng/mL

## 2022-05-31 LAB — ACTH: C206 ACTH: 25 pg/mL (ref 6–50)

## 2022-07-17 ENCOUNTER — Ambulatory Visit: Payer: PRIVATE HEALTH INSURANCE | Admitting: Internal Medicine

## 2023-04-02 ENCOUNTER — Other Ambulatory Visit: Payer: Self-pay | Admitting: Family Medicine

## 2023-04-02 DIAGNOSIS — Z8249 Family history of ischemic heart disease and other diseases of the circulatory system: Secondary | ICD-10-CM

## 2023-05-10 ENCOUNTER — Other Ambulatory Visit: Payer: No Typology Code available for payment source

## 2024-04-14 ENCOUNTER — Ambulatory Visit: Admitting: Family Medicine

## 2024-04-17 ENCOUNTER — Ambulatory Visit: Admitting: Family Medicine

## 2024-06-23 ENCOUNTER — Ambulatory Visit: Admitting: Family Medicine
